# Patient Record
Sex: Male | Born: 2000 | Race: Black or African American | Hispanic: No | Marital: Single | Smoking: Never smoker
Health system: Southern US, Community
[De-identification: ages and names within clinical notes are randomized; demographics above are authoritative.]

---
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
## 2021-08-15 ENCOUNTER — Encounter (HOSPITAL_COMMUNITY): Payer: Self-pay | Admitting: *Deleted

## 2021-08-15 ENCOUNTER — Emergency Department (HOSPITAL_COMMUNITY)

## 2021-08-15 ENCOUNTER — Other Ambulatory Visit: Payer: Self-pay

## 2021-08-15 ENCOUNTER — Emergency Department (HOSPITAL_COMMUNITY)
Admission: EM | Admit: 2021-08-15 | Discharge: 2021-08-15 | Disposition: A | Discharge status: Home / Self Care | Attending: Emergency Medicine | Admitting: Emergency Medicine

## 2021-08-15 DIAGNOSIS — R000 Tachycardia, unspecified: Secondary | ICD-10-CM | POA: Diagnosis not present

## 2021-08-15 DIAGNOSIS — R197 Diarrhea, unspecified: Secondary | ICD-10-CM | POA: Diagnosis not present

## 2021-08-15 DIAGNOSIS — R110 Nausea: Secondary | ICD-10-CM | POA: Diagnosis not present

## 2021-08-15 DIAGNOSIS — D72829 Elevated white blood cell count, unspecified: Secondary | ICD-10-CM | POA: Insufficient documentation

## 2021-08-15 DIAGNOSIS — R630 Anorexia: Secondary | ICD-10-CM | POA: Diagnosis not present

## 2021-08-15 DIAGNOSIS — R1084 Generalized abdominal pain: Secondary | ICD-10-CM | POA: Insufficient documentation

## 2021-08-15 LAB — COMPREHENSIVE METABOLIC PANEL
ALT: 36 U/L (ref 0–44)
AST: 24 U/L (ref 15–41)
Albumin: 4.7 g/dL (ref 3.5–5.0)
Alkaline Phosphatase: 46 U/L (ref 38–126)
Anion gap: 13 (ref 5–15)
BUN: 9 mg/dL (ref 6–20)
CO2: 21 mmol/L — ABNORMAL LOW (ref 22–32)
Calcium: 9.2 mg/dL (ref 8.9–10.3)
Chloride: 102 mmol/L (ref 98–111)
Creatinine, Ser: 1 mg/dL (ref 0.61–1.24)
GFR, Estimated: >60 mL/min (ref >60)
Glucose, Bld: 107 mg/dL — ABNORMAL HIGH (ref 70–99)
Potassium: 3.4 mmol/L — ABNORMAL LOW (ref 3.5–5.1)
Sodium: 136 mmol/L (ref 135–145)
Total Bilirubin: 1 mg/dL (ref 0.3–1.2)
Total Protein: 8.5 g/dL — ABNORMAL HIGH (ref 6.5–8.1)

## 2021-08-15 LAB — URINALYSIS, ROUTINE W REFLEX MICROSCOPIC
Bilirubin Urine: NEGATIVE
Glucose, UA: NEGATIVE mg/dL
Hgb urine dipstick: NEGATIVE
Ketones, ur: 80 mg/dL — AB
Leukocytes,Ua: NEGATIVE
Nitrite: NEGATIVE
Protein, ur: NEGATIVE mg/dL
Specific Gravity, Urine: 1.039 — ABNORMAL HIGH (ref 1.005–1.030)
pH: 6 (ref 5.0–8.0)

## 2021-08-15 LAB — CBC
HCT: 44 % (ref 39.0–52.0)
Hemoglobin: 14.6 g/dL (ref 13.0–17.0)
MCH: 29.6 pg (ref 26.0–34.0)
MCHC: 33.2 g/dL (ref 30.0–36.0)
MCV: 89.2 fL (ref 80.0–100.0)
Platelets: 228 10*3/uL (ref 150–400)
RBC: 4.93 MIL/uL (ref 4.22–5.81)
RDW: 13.8 % (ref 11.5–15.5)
WBC: 12 10*3/uL — ABNORMAL HIGH (ref 4.0–10.5)
nRBC: 0 % (ref 0.0–0.2)

## 2021-08-15 LAB — LIPASE, BLOOD: Lipase: 22 U/L (ref 11–51)

## 2021-08-15 MED ORDER — IOHEXOL 350 MG/ML SOLN
80.0000 mL | Freq: Once | INTRAVENOUS | Status: AC | PRN
  Administered 2021-08-15: 80 mL via INTRAVENOUS

## 2021-08-15 MED ORDER — AZITHROMYCIN 500 MG PO TABS: 500.0000 mg | ORAL_TABLET | Freq: Every day | ORAL | 0 refills | Status: DC

## 2021-08-15 MED ORDER — KETOROLAC TROMETHAMINE 30 MG/ML IJ SOLN
30.0000 mg | Freq: Once | INTRAMUSCULAR | Status: AC
  Administered 2021-08-15: 30 mg via INTRAVENOUS
  Filled 2021-08-15: qty 1

## 2021-08-15 MED ORDER — SODIUM CHLORIDE 0.9 % IV BOLUS
1000.0000 mL | Freq: Once | INTRAVENOUS | Status: AC
  Administered 2021-08-15: 1000 mL via INTRAVENOUS

## 2021-08-15 NOTE — ED Provider Notes (Signed)
[facility]-EMERGENCY DEPT Provider Note   CSN: [id number] Arrival date & time: 08/15/21  1040     History Chief Complaint  Patient presents with   Abdominal Pain    Sean Padilla is a 20 y.o. male.  HPI  20 year old male with no admitted past medical history presents emergency department abdominal pain and diarrhea.  Patient states that this started 2 days ago.  Initially the diarrhea was watery and is now transition to [facility] with some scattered bright red blood.  No clots.  His pain was initially epigastric but now it has become more diffuse.  He describes it as cramping sensation, slightly relieved with bowel movements but then returns.  Decreased appetite and associated nausea without vomiting.  No documented fever.  Patient had traveled internationally about 3 months ago when he returned from [place].  No previous surgeries in the abdomen.  No genitourinary symptoms.  Denies rectal intercourse.  History reviewed. No pertinent past medical history.  There are no problems to display for this patient.   History reviewed. No pertinent surgical history.     Family History  Problem Relation Age of Onset   Asthma Mother     Social History   Tobacco Use   Smoking status: Never   Smokeless tobacco: Never    Home Medications Prior to Admission medications   Not on File    Allergies    Patient has no known allergies.  Review of Systems   Review of Systems  Constitutional:  Positive for appetite change, chills and fatigue. Negative for fever.  HENT:  Negative for congestion.   Eyes:  Negative for visual disturbance.  Respiratory:  Negative for shortness of breath.   Cardiovascular:  Negative for chest pain.  Gastrointestinal:  Positive for abdominal pain, blood in stool, diarrhea and nausea. Negative for abdominal distention, rectal pain and vomiting.  Genitourinary:  Negative for dysuria.  Skin:  Negative for rash.   Neurological:  Negative for headaches.   Physical Exam Updated Vital Signs BP 108/82   Pulse (!) 102   Temp 98.7 F (37.1 C) (Oral)   Resp 18   SpO2 92%   Physical Exam Vitals and nursing note reviewed.  Constitutional:      Appearance: Normal appearance.  HENT:     Head: Normocephalic.     Mouth/Throat:     Mouth: Mucous membranes are moist.  Cardiovascular:     Rate and Rhythm: Normal rate.  Pulmonary:     Effort: Pulmonary effort is normal. No respiratory distress.  Abdominal:     General: There is no distension.     Palpations: Abdomen is soft.     Tenderness: There is generalized abdominal tenderness. There is no guarding or rebound.  Skin:    General: Skin is warm.  Neurological:     Mental Status: He is alert and oriented to person, place, and time. Mental status is at baseline.  Psychiatric:        Mood and Affect: Mood normal.    ED Results / Procedures / Treatments   Labs (all labs ordered are listed, but only abnormal results are displayed) Labs Reviewed  COMPREHENSIVE METABOLIC PANEL - Abnormal; Notable for the following components:      Result Value   Potassium 3.4 (*)    CO2 21 (*)    Glucose, Bld 107 (*)    Total Protein 8.5 (*)    All other components within normal limits  CBC - Abnormal;  Notable for the following components:   WBC 12.0 (*)    All other components within normal limits  LIPASE, BLOOD  URINALYSIS, ROUTINE W REFLEX MICROSCOPIC    EKG None  Radiology No results found.  Procedures Procedures   Medications Ordered in ED Medications  sodium chloride 0.9 % bolus 1,000 mL (1,000 mLs Intravenous Bolus 08/15/21 1227)  ketorolac (TORADOL) 30 MG/ML injection 30 mg (30 mg Intravenous Given 08/15/21 1226)    ED Course  I have reviewed the triage vital signs and the nursing notes.  Pertinent labs & imaging results that were available during my care of the patient were reviewed by me and considered in my medical decision making  (see chart for details).    MDM Rules/Calculators/A&P                            20 year old male presents emergency department with generalized abdominal pain, diarrhea with 1 episode of bloody diarrhea.  Afebrile on arrival, tachycardic.  Benign abdominal exam.    Blood work shows a mild leukocytosis.  CAT scan shows inflammation of the sigmoid colon but also a mildly dilated appendix that is fluid-filled.  Spoke with surgical PA who states very low suspicion for early acute appendicitis at this time.  We will plan to treat him for possible infectious colitis and refer to gastroenterology for evaluation of possible inflammatory colitis.  Tachycardia and discomfort resolved with fluids/pain management.  Patient at this time appears safe and stable for discharge and will be treated as an outpatient.  Discharge plan and strict return to ED precautions discussed, patient verbalizes understanding and agreement.  Final Clinical Impression(s) / ED Diagnoses Final diagnoses:  None    Rx / DC Orders ED Discharge Orders     None        Rozelle Logan, DO 08/15/21 1544

## 2021-08-15 NOTE — Discharge Instructions (Addendum)
You have been seen and discharged from the emergency department.  Your CAT scan showed inflammation of the sigmoid colon that could be infectious or inflammatory/IBS.  Take antibiotics as directed.  If continuing to have symptoms follow-up with gastroenterology, may require colonoscopy for evaluation of IBS.  Follow-up with your primary provider for reevaluation and further care. Take home medications as prescribed. If you have any worsening symptoms or further concerns for your health please return to an emergency department for further evaluation.

## 2021-08-15 NOTE — ED Notes (Signed)
Provided pt with a urinal 

## 2021-08-15 NOTE — ED Triage Notes (Addendum)
Pt complains of abdominal pain, lackof appetite, blood in stool x 2 days. No vomiting

## 2021-08-18 ENCOUNTER — Other Ambulatory Visit: Payer: Self-pay

## 2021-08-18 ENCOUNTER — Emergency Department (HOSPITAL_COMMUNITY)
Admission: EM | Admit: 2021-08-18 | Discharge: 2021-08-18 | Disposition: A | Discharge status: Home / Self Care | Attending: Emergency Medicine | Admitting: Emergency Medicine

## 2021-08-18 ENCOUNTER — Encounter (HOSPITAL_COMMUNITY): Payer: Self-pay

## 2021-08-18 DIAGNOSIS — T43221A Poisoning by selective serotonin reuptake inhibitors, accidental (unintentional), initial encounter: Secondary | ICD-10-CM | POA: Diagnosis not present

## 2021-08-18 DIAGNOSIS — X58XXXA Exposure to other specified factors, initial encounter: Secondary | ICD-10-CM | POA: Insufficient documentation

## 2021-08-18 DIAGNOSIS — K529 Noninfective gastroenteritis and colitis, unspecified: Secondary | ICD-10-CM

## 2021-08-18 DIAGNOSIS — T50901A Poisoning by unspecified drugs, medicaments and biological substances, accidental (unintentional), initial encounter: Secondary | ICD-10-CM

## 2021-08-18 LAB — CBC WITH DIFFERENTIAL/PLATELET
Abs Immature Granulocytes: 0.03 10*3/uL (ref 0.00–0.07)
Basophils Absolute: 0 10*3/uL (ref 0.0–0.1)
Basophils Relative: 0 %
Eosinophils Absolute: 0 10*3/uL (ref 0.0–0.5)
Eosinophils Relative: 0 %
HCT: 45 % (ref 39.0–52.0)
Hemoglobin: 15 g/dL (ref 13.0–17.0)
Immature Granulocytes: 0 %
Lymphocytes Relative: 33 %
Lymphs Abs: 3.6 10*3/uL (ref 0.7–4.0)
MCH: 29.2 pg (ref 26.0–34.0)
MCHC: 33.3 g/dL (ref 30.0–36.0)
MCV: 87.7 fL (ref 80.0–100.0)
Monocytes Absolute: 1.7 10*3/uL — ABNORMAL HIGH (ref 0.1–1.0)
Monocytes Relative: 16 %
Neutro Abs: 5.4 10*3/uL (ref 1.7–7.7)
Neutrophils Relative %: 51 %
Platelets: 292 10*3/uL (ref 150–400)
RBC: 5.13 MIL/uL (ref 4.22–5.81)
RDW: 13.7 % (ref 11.5–15.5)
WBC: 10.9 10*3/uL — ABNORMAL HIGH (ref 4.0–10.5)
nRBC: 0 % (ref 0.0–0.2)

## 2021-08-18 LAB — COMPREHENSIVE METABOLIC PANEL
ALT: 24 U/L (ref 0–44)
AST: 17 U/L (ref 15–41)
Albumin: 4.3 g/dL (ref 3.5–5.0)
Alkaline Phosphatase: 47 U/L (ref 38–126)
Anion gap: 11 (ref 5–15)
BUN: 7 mg/dL (ref 6–20)
CO2: 25 mmol/L (ref 22–32)
Calcium: 9.3 mg/dL (ref 8.9–10.3)
Chloride: 101 mmol/L (ref 98–111)
Creatinine, Ser: 0.92 mg/dL (ref 0.61–1.24)
GFR, Estimated: >60 mL/min (ref >60)
Glucose, Bld: 97 mg/dL (ref 70–99)
Potassium: 3.2 mmol/L — ABNORMAL LOW (ref 3.5–5.1)
Sodium: 137 mmol/L (ref 135–145)
Total Bilirubin: 0.7 mg/dL (ref 0.3–1.2)
Total Protein: 8.2 g/dL — ABNORMAL HIGH (ref 6.5–8.1)

## 2021-08-18 LAB — LIPASE, BLOOD: Lipase: 26 U/L (ref 11–51)

## 2021-08-18 MED ORDER — AZITHROMYCIN 250 MG PO TABS
500.0000 mg | ORAL_TABLET | Freq: Once | ORAL | Status: AC
Start: 2021-08-18 — End: 2021-08-18
  Administered 2021-08-18: 500 mg via ORAL
  Filled 2021-08-18: qty 2

## 2021-08-18 MED ORDER — SODIUM CHLORIDE 0.9 % IV BOLUS: 1000.0000 mL | Freq: Once | INTRAVENOUS | Status: DC

## 2021-08-18 MED ORDER — DICYCLOMINE HCL 20 MG PO TABS: 20.0000 mg | ORAL_TABLET | Freq: Two times a day (BID) | ORAL | 0 refills | Status: AC | PRN

## 2021-08-18 MED ORDER — AZITHROMYCIN 500 MG PO TABS: 500.0000 mg | ORAL_TABLET | Freq: Every day | ORAL | 0 refills | Status: AC

## 2021-08-18 NOTE — ED Triage Notes (Signed)
Pt reports being seen on 8/25 and was diagnosed with IBS. Pt was given prescription for azithromycin. Pt reports picking up his prescription and taking as prescribed. Pt states that he is not feeling any better and he noticed today that the pharmacy gave him the wrong prescription. Pt has been taking sertraline over the past few days unknowingly. Pt denies any side effects or symptoms from the medication.

## 2021-08-18 NOTE — ED Notes (Signed)
Pt d/c home per MD order. Discharge summary reviewed with pt, pt verbalizes understanding. No s/s of acute distress noted at discharge. Ambulatory off unit.  

## 2021-08-18 NOTE — ED Provider Notes (Signed)
[facility] EMERGENCY DEPARTMENT Provider Note  CSN: [id number] Arrival date & time: 08/18/21 1117    History Chief Complaint  Patient presents with   Diarrhea    Sean Padilla is a 20 y.o. male recent seen for diarrhea, had a full working including labs and CT concerning for colitis. He was given Rx for Zithromax on 8/25. He reports he was taking the pills but not getting any better and also began having insomnia and restlessness. He checked the pill bottle and noticed for the first time that it was not Zithromax but was actually a bottle of Sertraline 100mg  tabs prescribed to someone else.   History reviewed. No pertinent past medical history.  History reviewed. No pertinent surgical history.  Family History  Problem Relation Age of Onset   Asthma Mother     Social History   Tobacco Use   Smoking status: Never   Smokeless tobacco: Never     Home Medications Prior to Admission medications   Medication Sig Start Date End Date Taking? Authorizing Provider  azithromycin (ZITHROMAX) 500 MG tablet Take 1 tablet (500 mg total) by mouth daily for 4 days. 08/18/21 08/22/21  10/22/21, MD     Allergies    Shellfish-derived products   Review of Systems   Review of Systems A comprehensive review of systems was completed and negative except as noted in HPI.    Physical Exam BP (!) 141/70   Pulse 80   Temp 99.7 F (37.6 C) (Oral)   Resp 18   SpO2 98%   Physical Exam Vitals and nursing note reviewed.  Constitutional:      Appearance: Normal appearance.  HENT:     Head: Normocephalic and atraumatic.     Nose: Nose normal.     Mouth/Throat:     Mouth: Mucous membranes are moist.  Eyes:     Extraocular Movements: Extraocular movements intact.     Conjunctiva/sclera: Conjunctivae normal.  Cardiovascular:     Rate and Rhythm: Normal rate.  Pulmonary:     Effort: Pulmonary effort is normal.     Breath sounds: Normal breath sounds.  Abdominal:      General: Abdomen is flat.     Palpations: Abdomen is soft.     Tenderness: There is no abdominal tenderness (mild LLQ, no guarding).  Musculoskeletal:        General: No swelling. Normal range of motion.     Cervical back: Neck supple.  Skin:    General: Skin is warm and dry.  Neurological:     General: No focal deficit present.     Mental Status: He is alert.  Psychiatric:        Mood and Affect: Mood normal.     ED Results / Procedures / Treatments   Labs (all labs ordered are listed, but only abnormal results are displayed) Labs Reviewed  COMPREHENSIVE METABOLIC PANEL - Abnormal; Notable for the following components:      Result Value   Potassium 3.2 (*)    Total Protein 8.2 (*)    All other components within normal limits  CBC WITH DIFFERENTIAL/PLATELET - Abnormal; Notable for the following components:   WBC 10.9 (*)    Monocytes Absolute 1.7 (*)    All other components within normal limits  LIPASE, BLOOD    EKG None  Radiology No results found.  Procedures Procedures  Medications Ordered in the ED Medications  azithromycin (ZITHROMAX) tablet 500 mg (has no administration in time  range)     MDM Rules/Calculators/A&P MDM Patient well appearing, PA in triage spoke with poison control, no additional intervention necessary, advised to stop the sertaline. He will be re-prescribed the Zithromax, first dose in the ED and referred to GI for further evaluation if symptoms do not improve.   ED Course  I have reviewed the triage vital signs and the nursing notes.  Pertinent labs & imaging results that were available during my care of the patient were reviewed by me and considered in my medical decision making (see chart for details).  Clinical Course as of 08/18/21 1634  Sun Aug 18, 2021  1353 I spoke with poison control.  They recommend discontinuing the zoloft, no need to taper, No obs period needed, no specific treatment needed.  [EH]  1630 CBC with improved  WBC. CMP and lipase unremarkable. Not having any urinary symptoms, UA cancelled.  [CS]    Clinical Course User Index [CS] Pollyann Savoy, MD [EH] Cristina Gong, PA-C    Final Clinical Impression(s) / ED Diagnoses Final diagnoses:  Colitis  Accidental drug ingestion, initial encounter    Rx / DC Orders ED Discharge Orders          Ordered    azithromycin (ZITHROMAX) 500 MG tablet  Daily        08/18/21 1634             Pollyann Savoy, MD 08/18/21 [phone]

## 2021-08-18 NOTE — ED Provider Notes (Signed)
Emergency Medicine Provider Triage Evaluation Note  Sean Padilla , a 20 y.o. male  was evaluated in triage.  Pt complains of nausea, abdominal pain, feeling restless and inability to sleep.  He was seen in the emergency room on 8/25 for GI concerns, was given a prescription for azithromycin to take 2 pills the first day and 1 every day after until gone.  He started at that day.  He states that since then he has had increasing nausea.  He has not had any new fevers, feels like the pain is in the same location which is bilateral lower abdomen, left worse than right.   He states that today when he was not feeling better he took his medicine, and then checked the bottle and realized that it was for a prescription of sertraline, 100 mg with someone else's name on it that he states he was given at the pharmacy.  He has not previously taken any SSRIs.  He states that since starting this medicine he has felt very restless and had difficulty sleeping.  Review of Systems  Positive: Restless, can't sleep, decreased urination, nausea, poor PO intake, abdominal pain Negative: Chest pain  Physical Exam  BP (!) 144/82 (BP Location: Right Arm)   Pulse 100   Temp 99.7 F (37.6 C) (Oral)   Resp 16   SpO2 98%  Gen:   Awake, no distress   Resp:  Normal effort  MSK:   Moves extremities without difficulty  Other:  Abdomen is TTP bilateral lower abdomen, left greater than right.   Medical Decision Making  Medically screening exam initiated at 12:12 PM.  Appropriate orders placed.  Sanda Klein Dier was informed that the remainder of the evaluation will be completed by another provider, this initial triage assessment does not replace that evaluation, and the importance of remaining in the ED until their evaluation is complete.  Note: Portions of this report may have been transcribed using voice recognition software. Every effort was made to ensure accuracy; however, inadvertent computerized transcription  errors may be present    Cristina Gong, PA-C 08/18/21 1221    Virgina Norfolk, DO 08/18/21 1511

## 2021-08-20 ENCOUNTER — Ambulatory Visit: Payer: TRICARE For Life (TFL) | Admitting: Physician Assistant

## 2022-07-27 IMAGING — CT CT ABD-PELV W/ CM
2 of 4 series · 16 of 46 positions shown, 18 images · IV contrast (omnipaque)
Comparison: None.

CLINICAL DATA: Abdominal pain, acute, nonlocalized

EXAM:
CT ABDOMEN AND PELVIS WITH CONTRAST
TECHNIQUE: Multidetector CT imaging of the abdomen and pelvis was performed
using the standard protocol following bolus administration of
intravenous contrast.
CONTRAST:  80mL OMNIPAQUE IOHEXOL 350 MG/ML SOLN

[Series 2: axial st · axial · 0.92mm/px · z∈[+1061,+1471]mm · 13 of 94 slices shown, 15 images]
[im 6/94  soft-tissue]
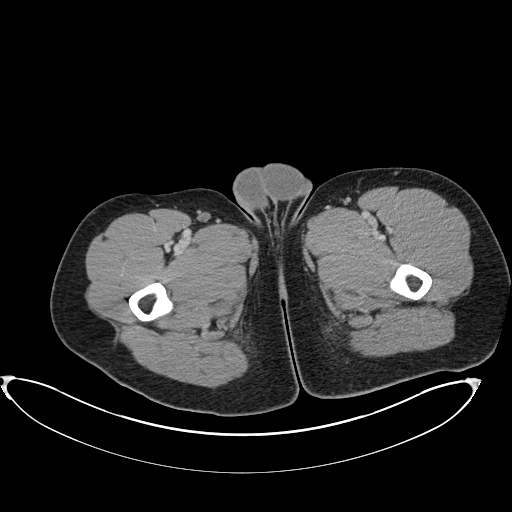
[im 6/94  bone]
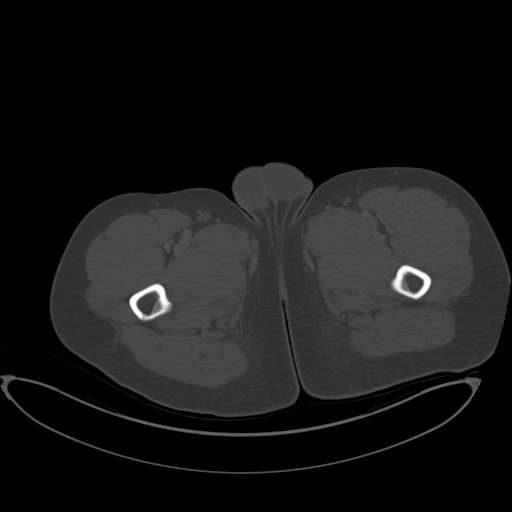
[im 11/94  soft-tissue]
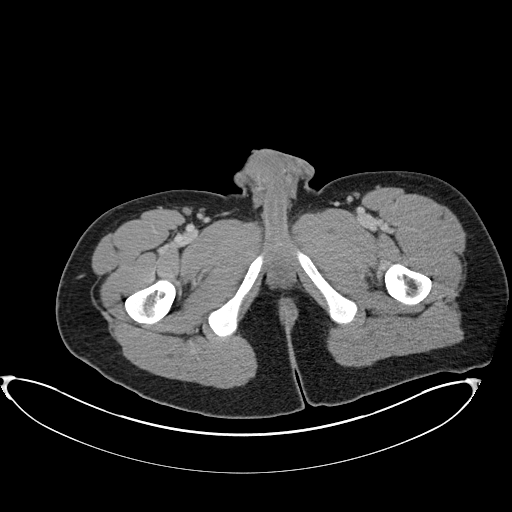
[im 22/94  soft-tissue]
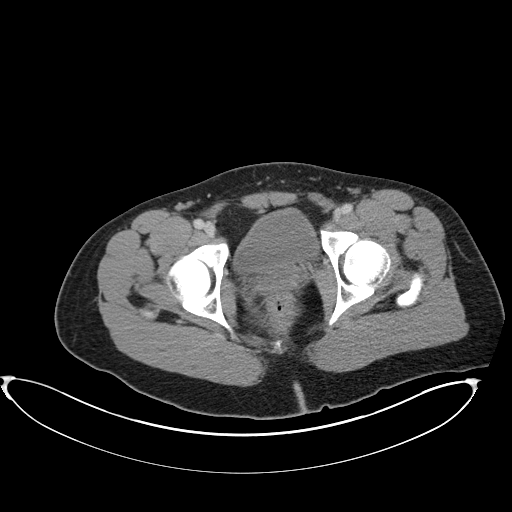
[im 28/94  soft-tissue]
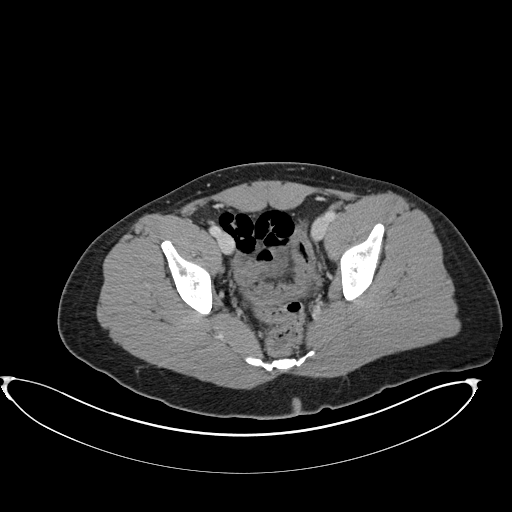
[im 33/94  soft-tissue]
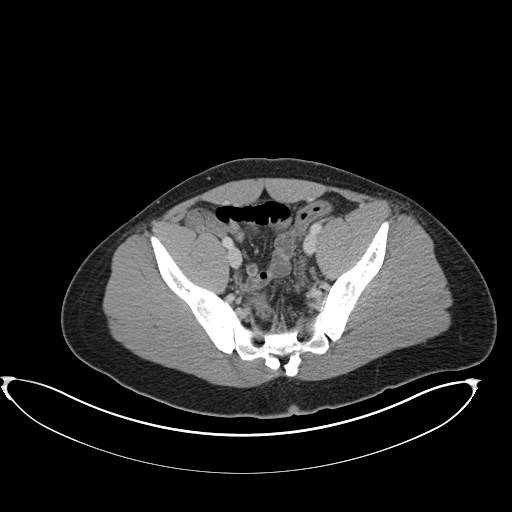
[im 39/94  soft-tissue]
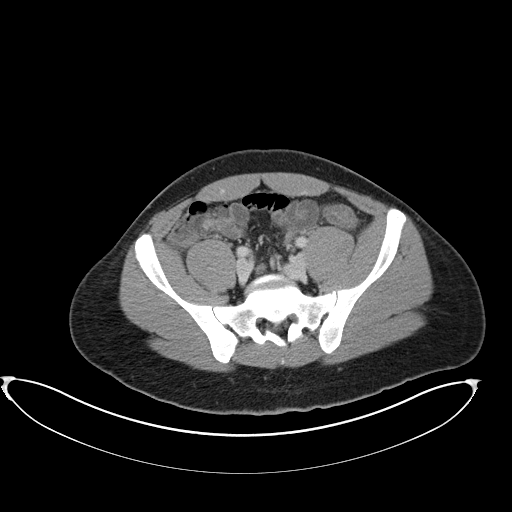
[im 50/94  soft-tissue]
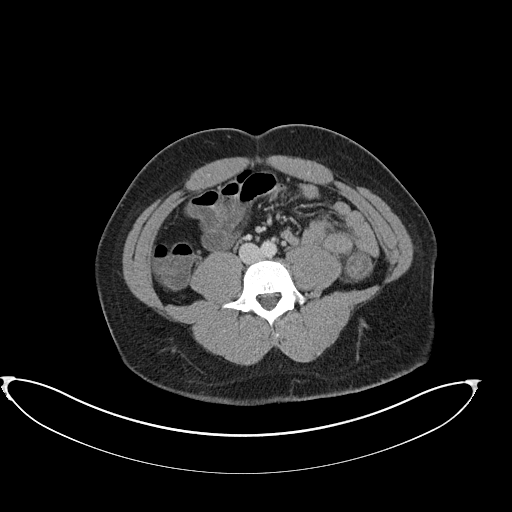
[im 55/94  soft-tissue]
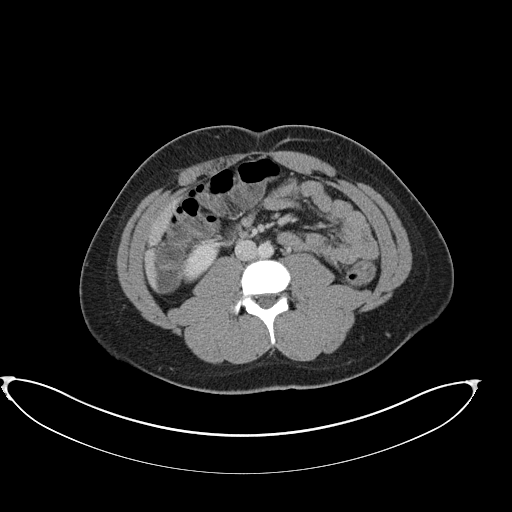
[im 61/94  soft-tissue]
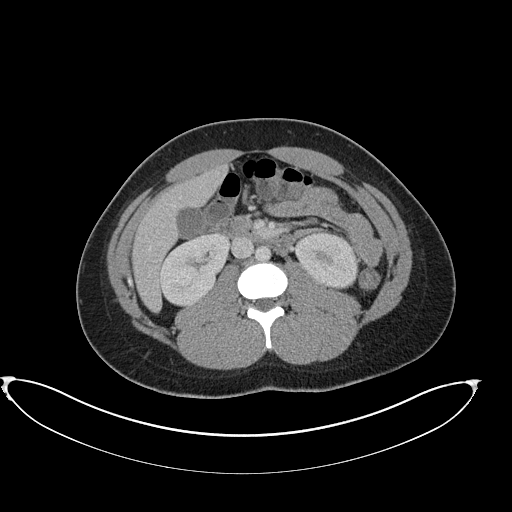
[im 61/94  bone]
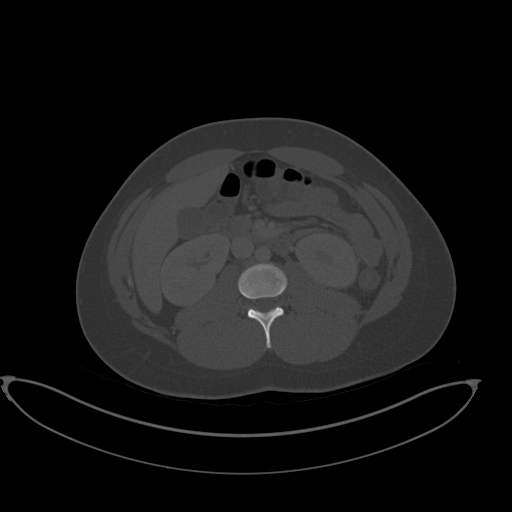
[im 66/94  soft-tissue]
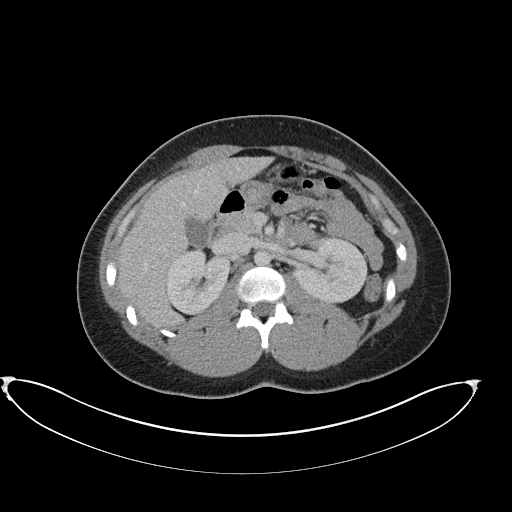
[im 72/94  soft-tissue]
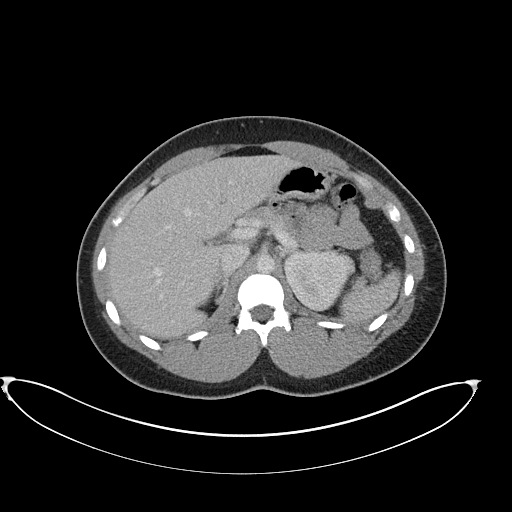
[im 83/94  soft-tissue]
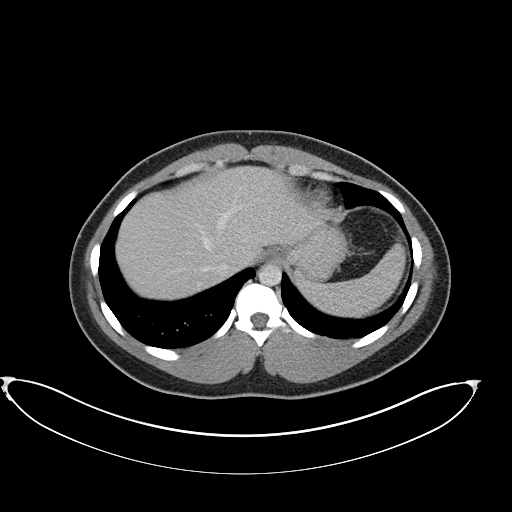
[im 88/94  soft-tissue]
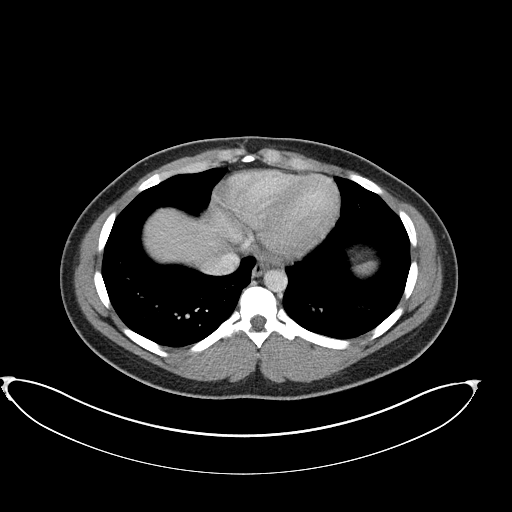

[Series 5: coronal st · coronal · 0.89mm/px · 3 of 155 slices shown]
[im 52/155  soft-tissue]
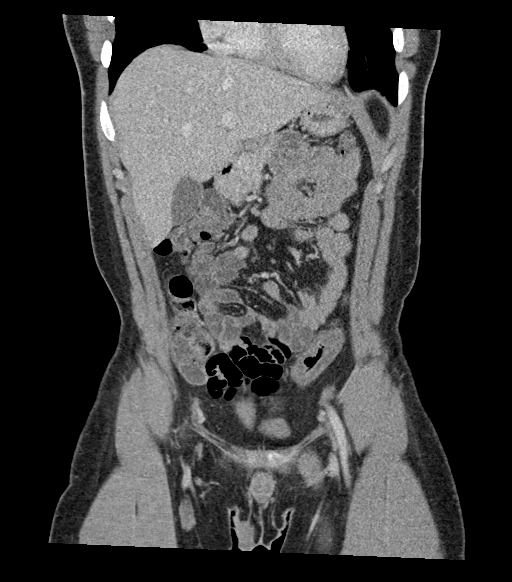
[im 69/155  soft-tissue]
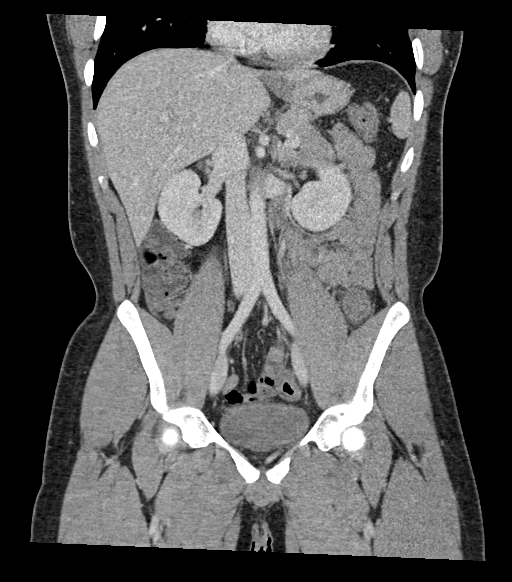
[im 86/155  soft-tissue]
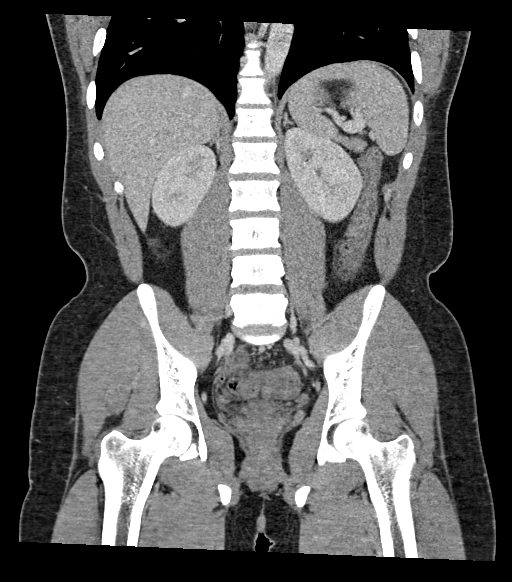

[16 of 46 positions shown; findings below may reference images not displayed]

FINDINGS: Lower chest: Included lung bases are clear.  Heart size is normal.

Hepatobiliary: No focal liver abnormality is seen. No gallstones,
gallbladder wall thickening, or biliary dilatation.

Pancreas: Unremarkable. No pancreatic ductal dilatation or
surrounding inflammatory changes.

Spleen: Normal in size without focal abnormality.

Adrenals/Urinary Tract: Adrenal glands are unremarkable. Kidneys are
normal, without renal calculi, focal lesion, or hydronephrosis.
Bladder is unremarkable.

Stomach/Bowel: Long segment circumferential wall thickening of the
descending colon and proximal sigmoid colon with mild pericolonic
fat stranding. Liquid stool within the right colon. The appendix is
fluid-filled and mildly dilated measuring approximately 9 mm in
diameter. No periappendiceal fat stranding. Multiple fluid-filled,
nondilated loops of small bowel without associated wall thickening.
Stomach within normal limits.

Vascular/Lymphatic: No significant vascular findings are present. No
enlarged abdominal or pelvic lymph nodes.

Reproductive: Prostate is unremarkable.

Other: No free fluid. No abdominopelvic fluid collection. No
pneumoperitoneum. No abdominal wall hernia.

Musculoskeletal: No acute or significant osseous findings.
IMPRESSION: 1. Long segment circumferential wall thickening of the descending
colon and proximal sigmoid colon with mild pericolonic fat
stranding, compatible with an infectious or inflammatory colitis.
2. The appendix is fluid-filled and mildly dilated without
periappendiceal fat stranding. Findings are felt unlikely to
represent early acute appendicitis.
